# Patient Record
Sex: Male | Born: 1965 | Race: Black or African American | Hispanic: No | Marital: Single | Smoking: Current every day smoker
Health system: Southern US, Community
[De-identification: ages and names within clinical notes are randomized; demographics above are authoritative.]

---

## 2008-12-25 ENCOUNTER — Emergency Department: Payer: Self-pay | Admitting: Emergency Medicine

## 2009-06-01 ENCOUNTER — Emergency Department: Payer: Self-pay | Admitting: Emergency Medicine

## 2009-12-09 ENCOUNTER — Emergency Department: Payer: Self-pay

## 2011-01-19 ENCOUNTER — Emergency Department: Payer: Self-pay | Admitting: Emergency Medicine

## 2017-04-04 ENCOUNTER — Encounter: Payer: Self-pay | Admitting: Emergency Medicine

## 2017-04-04 ENCOUNTER — Emergency Department
Admission: EM | Admit: 2017-04-04 | Discharge: 2017-04-04 | Disposition: A | Discharge status: Home / Self Care | Payer: Managed Care, Other (non HMO) | Attending: Emergency Medicine | Admitting: Emergency Medicine

## 2017-04-04 ENCOUNTER — Emergency Department: Payer: Managed Care, Other (non HMO)

## 2017-04-04 DIAGNOSIS — R0789 Other chest pain: Secondary | ICD-10-CM | POA: Insufficient documentation

## 2017-04-04 DIAGNOSIS — F17210 Nicotine dependence, cigarettes, uncomplicated: Secondary | ICD-10-CM | POA: Insufficient documentation

## 2017-04-04 DIAGNOSIS — R079 Chest pain, unspecified: Secondary | ICD-10-CM | POA: Diagnosis present

## 2017-04-04 LAB — BASIC METABOLIC PANEL
ANION GAP: 8 (ref 5–15)
BUN: 16 mg/dL (ref 6–20)
CALCIUM: 9.8 mg/dL (ref 8.9–10.3)
CO2: 26 mmol/L (ref 22–32)
Chloride: 102 mmol/L (ref 101–111)
Creatinine, Ser: 0.86 mg/dL (ref 0.61–1.24)
GFR calc Af Amer: >60 mL/min (ref >60)
GLUCOSE: 102 mg/dL — AB (ref 65–99)
Potassium: 4 mmol/L (ref 3.5–5.1)
Sodium: 136 mmol/L (ref 135–145)

## 2017-04-04 LAB — CBC
HCT: 45 % (ref 40.0–52.0)
Hemoglobin: 15.4 g/dL (ref 13.0–18.0)
MCH: 31.8 pg (ref 26.0–34.0)
MCHC: 34.1 g/dL (ref 32.0–36.0)
MCV: 93.3 fL (ref 80.0–100.0)
PLATELETS: 219 10*3/uL (ref 150–440)
RBC: 4.82 MIL/uL (ref 4.40–5.90)
RDW: 13.9 % (ref 11.5–14.5)
WBC: 10.3 10*3/uL (ref 3.8–10.6)

## 2017-04-04 LAB — TROPONIN I: Troponin I: <0.03 ng/mL (ref <0.03)

## 2017-04-04 MED ORDER — IBUPROFEN 600 MG PO TABS: 600.0000 mg | ORAL_TABLET | Freq: Three times a day (TID) | ORAL | 0 refills | Status: AC | PRN

## 2017-04-04 MED ORDER — KETOROLAC TROMETHAMINE 30 MG/ML IJ SOLN
15.0000 mg | Freq: Once | INTRAMUSCULAR | Status: DC
  Filled 2017-04-04: qty 1

## 2017-04-04 NOTE — Discharge Instructions (Addendum)
Please make an appointment to establish care with a primary care physician this coming week. Return to the emergency department sooner for any concerns.  It was a pleasure to take care of you today, and thank you for coming to our emergency department.  If you have any questions or concerns before leaving please ask the nurse to grab me and I'm more than happy to go through your aftercare instructions again.  If you were prescribed any opioid pain medication today such as Norco, Vicodin, Percocet, morphine, hydrocodone, or oxycodone please make sure you do not drive when you are taking this medication as it can alter your ability to drive safely.  If you have any concerns once you are home that you are not improving or are in fact getting worse before you can make it to your follow-up appointment, please do not hesitate to call 911 and come back for further evaluation.  Merrily BrittleNeil Faige Seely MD  Results for orders placed or performed during the hospital encounter of 04/04/17  CBC  Result Value Ref Range   WBC 10.3 3.8 - 10.6 K/uL   RBC 4.82 4.40 - 5.90 MIL/uL   Hemoglobin 15.4 13.0 - 18.0 g/dL   HCT 16.145.0 09.640.0 - 04.552.0 %   MCV 93.3 80.0 - 100.0 fL   MCH 31.8 26.0 - 34.0 pg   MCHC 34.1 32.0 - 36.0 g/dL   RDW 40.913.9 81.111.5 - 91.414.5 %   Platelets 219 150 - 440 K/uL  Basic metabolic panel  Result Value Ref Range   Sodium 136 135 - 145 mmol/L   Potassium 4.0 3.5 - 5.1 mmol/L   Chloride 102 101 - 111 mmol/L   CO2 26 22 - 32 mmol/L   Glucose, Bld 102 (H) 65 - 99 mg/dL   BUN 16 6 - 20 mg/dL   Creatinine, Ser 7.820.86 0.61 - 1.24 mg/dL   Calcium 9.8 8.9 - 95.610.3 mg/dL   GFR calc non Af Amer >60 >60 mL/min   GFR calc Af Amer >60 >60 mL/min   Anion gap 8 5 - 15  Troponin I  Result Value Ref Range   Troponin I <0.03 <0.03 ng/mL   Dg Chest 2 View  Result Date: 04/04/2017 CLINICAL DATA:  Upper anterior chest pain. EXAM: CHEST  2 VIEW COMPARISON:  12/09/2009 FINDINGS: The lungs are clear without focal pneumonia,  edema, pneumothorax or pleural effusion. Nodular density at the right base is probably a nipple shadow but is asymmetric. The cardiopericardial silhouette is within normal limits for size. The visualized bony structures of the thorax are intact. IMPRESSION: 1. Nodular opacity over the right lung base, likely a nipple shadow, but repeat frontal radiograph with nipple markers recommended to confirm. 2. Otherwise no acute cardiopulmonary findings. Electronically Signed   By: Kennith CenterEric  Mansell M.D.   On: 04/04/2017 17:04

## 2017-04-04 NOTE — ED Notes (Signed)
Patient transported to X-ray 

## 2017-04-04 NOTE — ED Notes (Signed)
Pt resting in bed, denies any needs, watching TV

## 2017-04-04 NOTE — ED Notes (Signed)
Was unable to collect both tubes of blood. Patient asked to get blood drawn in the room after an IV was placed. So he wouldn't be stuck multiple times.

## 2017-04-04 NOTE — ED Provider Notes (Signed)
[facility] Emergency Department Provider Note  ____________________________________________   First MD Initiated Contact with Patient 04/04/17 1708     (approximate)  I have reviewed the triage vital signs and the nursing notes.   HISTORY  Chief Complaint Chest Pain   HPI Daniel Wilkins is a 51 y.o. male who self presents to the emergency department with atypical chest pain ever since he awoke this morning. Pain is sharp and aching constant in his left upper chest. Somewhat worse with twisting or taking a deep breath. Nonexertional. No shortness of breath. No fevers or chills. No leg swelling. No immobilization. He takes no medications and has no past medical history.    History reviewed. No pertinent past medical history.  There are no active problems to display for this patient.   History reviewed. No pertinent surgical history.  Prior to Admission medications   Medication Sig Start Date End Date Taking? Authorizing Provider  ibuprofen (ADVIL,MOTRIN) 600 MG tablet Take 1 tablet (600 mg total) by mouth every 8 (eight) hours as needed. 04/04/17   Merrily Brittle, MD    Allergies Patient has no known allergies.  No family history on file.  Social History Social History  Substance Use Topics  . Smoking status: Current Every Day Smoker    Packs/day: 0.20    Types: Cigarettes  . Smokeless tobacco: Never Used  . Alcohol use Yes     Comment: beer daily    Review of Systems Constitutional: No fever/chills Eyes: No visual changes. ENT: No sore throat. Cardiovascular: Positive chest pain. Respiratory: Denies shortness of breath. Gastrointestinal: No abdominal pain.  No nausea, no vomiting.  No diarrhea.  No constipation. Genitourinary: Negative for dysuria. Musculoskeletal: Negative for back pain. Skin: Negative for rash. Neurological: Negative for headaches, focal weakness or  numbness.   ____________________________________________   PHYSICAL EXAM:  VITAL SIGNS: ED Triage Vitals  Enc Vitals Group     BP 04/04/17 1540 (!) 181/88     Pulse Rate 04/04/17 1540 99     Resp 04/04/17 1540 16     Temp 04/04/17 1540 98 F (36.7 C)     Temp Source 04/04/17 1540 Oral     SpO2 04/04/17 1540 100 %     Weight 04/04/17 1541 190 lb (86.2 kg)     Height 04/04/17 1541 5\' 6"  (1.676 m)     Head Circumference --      Peak Flow --      Pain Score 04/04/17 1550 7     Pain Loc --      Pain Edu? --      Excl. in GC? --     Constitutional: Alert and oriented 4 very well-appearing nontoxic no diaphoresis speaks in full clear sentences Eyes: PERRL EOMI. Head: Atraumatic. Nose: No congestion/rhinnorhea. Mouth/Throat: No trismus Neck: No stridor.   Cardiovascular: Normal rate, regular rhythm. Grossly normal heart sounds.  Good peripheral circulation. Focally tender across left lateral Respiratory: Normal respiratory effort.  No retractions. Lungs CTAB and moving good air Gastrointestinal: Soft nondistended nontender no rebound or guarding no peritonitis Musculoskeletal: No lower extremity edema   Neurologic:  Normal speech and language. No gross focal neurologic deficits are appreciated. Skin:  Skin is warm, dry and intact. No rash noted. Psychiatric: Mood and affect are normal. Speech and behavior are normal.    ____________________________________________   DIFFERENTIAL includes but not limited to  Acute coronary syndrome, pulmonary embolism, Boerhaave syndrome, aortic dissection, pneumothorax, hemothorax ____________________________________________  LABS (all labs ordered are listed, but only abnormal results are displayed)  Labs Reviewed  BASIC METABOLIC PANEL - Abnormal; Notable for the following:       Result Value   Glucose, Bld 102 (*)    All other components within normal limits  CBC  TROPONIN I    No signs of acute  ischemia __________________________________________  EKG  ED ECG REPORT I, Merrily BrittleNeil Aeron Donaghey, the attending physician, personally viewed and interpreted this ECG.  Date: 04/04/2017 Rate: 90 Rhythm: normal sinus rhythm QRS Axis: normal Intervals: normal ST/T Wave abnormalities: normal Narrative Interpretation: unremarkable  ____________________________________________  RADIOLOGY  Chest x-ray with no acute disease ____________________________________________   PROCEDURES  Procedure(s) performed: no  Procedures  Critical Care performed: no  Observation: no ____________________________________________   INITIAL IMPRESSION / ASSESSMENT AND PLAN / ED COURSE  Pertinent labs & imaging results that were available during my care of the patient were reviewed by me and considered in my medical decision making (see chart for details).  The patient arrives very well-appearing with atypical chest pain and a normal EKG. His pain is nonexertional sharp and pleuritic. Doubt pulmonary embolism and he is PERC negative aside from age.  His pain is quite reproducible and is improved with Toradol. At this point I'm comfortable discharging him home with primary care follow-up. He is discharged home in improved condition.      ____________________________________________   FINAL CLINICAL IMPRESSION(S) / ED DIAGNOSES  Final diagnoses:  Atypical chest pain      NEW MEDICATIONS STARTED DURING THIS VISIT:  New Prescriptions   IBUPROFEN (ADVIL,MOTRIN) 600 MG TABLET    Take 1 tablet (600 mg total) by mouth every 8 (eight) hours as needed.     Note:  This document was prepared using Dragon voice recognition software and may include unintentional dictation errors.     Merrily Brittleifenbark, Mansel Strother, MD 04/04/17 Rickey [id number]

## 2017-04-04 NOTE — ED Triage Notes (Signed)
Patient presents to the ED with left upper chest pain x 2 days radiating into left arm.  Patient states pain in chest is worse when patient moves his arms in certain ways.  Patient reports pain in left arm feels like, "pins and needles".  Patient is in no obvious distress at this time, denies any dizziness or shortness of breath.

## 2017-04-04 NOTE — ED Notes (Signed)
Pt resting in bed, refused pain meds at this time, states he is not in pain, will continue to offer toradol to pt

## 2020-01-02 ENCOUNTER — Ambulatory Visit: Payer: 59 | Attending: Internal Medicine

## 2020-01-02 DIAGNOSIS — Z23 Encounter for immunization: Secondary | ICD-10-CM

## 2020-01-02 NOTE — Progress Notes (Signed)
   Covid-19 Vaccination Clinic  Name:  SHERWOOD CASTILLA    MRN: [id number] DOB: [birth year]  01/02/2020  Mr. Lieb was observed post Covid-19 immunization for 15 minutes without incident. He was provided with Vaccine Information Sheet and instruction to access the V-Safe system.   Mr. Jutras was instructed to call 911 with any severe reactions post vaccine: Marland Kitchen Difficulty breathing  . Swelling of face and throat  . A fast heartbeat  . A bad rash all over body  . Dizziness and weakness   Immunizations Administered    Name Date Dose VIS Date Route   Pfizer COVID-19 Vaccine 01/02/2020  2:59 PM 0.3 mL 09/09/2019 Intramuscular   Manufacturer: [employer], [employer]   Lot: [id number]   NDC: [address]-4

## 2020-01-25 ENCOUNTER — Other Ambulatory Visit: Payer: Self-pay

## 2020-01-25 ENCOUNTER — Ambulatory Visit: Payer: 59 | Attending: Internal Medicine

## 2020-01-25 DIAGNOSIS — Z23 Encounter for immunization: Secondary | ICD-10-CM

## 2020-01-25 NOTE — Progress Notes (Signed)
   Covid-19 Vaccination Clinic  Name:  Daniel Wilkins    MRN: [id number] DOB: 12-01-1965  01/25/2020  [name] was observed post Covid-19 immunization for 15 minutes without incident. He was provided with Vaccine Information Sheet and instruction to access the V-Safe system.   [name] was instructed to call 911 with any severe reactions post vaccine: Marland Kitchen Difficulty breathing  . Swelling of face and throat  . A fast heartbeat  . A bad rash all over body  . Dizziness and weakness   Immunizations Administered    Name Date Dose VIS Date Route   Pfizer COVID-19 Vaccine 01/25/2020  1:43 PM 0.3 mL 11/23/2018 Intramuscular   Manufacturer: [employer], [employer]   Lot: [id number]   NDC: [address]-1

## 2020-07-10 ENCOUNTER — Emergency Department
Admission: EM | Admit: 2020-07-10 | Discharge: 2020-07-10 | Disposition: A | Discharge status: Home / Self Care | Payer: No Typology Code available for payment source | Attending: Emergency Medicine | Admitting: Emergency Medicine

## 2020-07-10 ENCOUNTER — Other Ambulatory Visit: Payer: Self-pay

## 2020-07-10 ENCOUNTER — Emergency Department: Payer: No Typology Code available for payment source

## 2020-07-10 DIAGNOSIS — F17210 Nicotine dependence, cigarettes, uncomplicated: Secondary | ICD-10-CM | POA: Diagnosis not present

## 2020-07-10 DIAGNOSIS — M542 Cervicalgia: Secondary | ICD-10-CM | POA: Diagnosis present

## 2020-07-10 DIAGNOSIS — I10 Essential (primary) hypertension: Secondary | ICD-10-CM | POA: Diagnosis not present

## 2020-07-10 DIAGNOSIS — Z79899 Other long term (current) drug therapy: Secondary | ICD-10-CM | POA: Diagnosis not present

## 2020-07-10 DIAGNOSIS — Z720 Tobacco use: Secondary | ICD-10-CM

## 2020-07-10 DIAGNOSIS — I6523 Occlusion and stenosis of bilateral carotid arteries: Secondary | ICD-10-CM | POA: Diagnosis not present

## 2020-07-10 DIAGNOSIS — R202 Paresthesia of skin: Secondary | ICD-10-CM | POA: Diagnosis not present

## 2020-07-10 DIAGNOSIS — Z7141 Alcohol abuse counseling and surveillance of alcoholic: Secondary | ICD-10-CM | POA: Insufficient documentation

## 2020-07-10 LAB — PROTIME-INR
INR: 0.8 (ref 0.8–1.2)
Prothrombin Time: 11.2 seconds — ABNORMAL LOW (ref 11.4–15.2)

## 2020-07-10 LAB — COMPREHENSIVE METABOLIC PANEL
ALT: 18 U/L (ref 0–44)
AST: 23 U/L (ref 15–41)
Albumin: 4.5 g/dL (ref 3.5–5.0)
Alkaline Phosphatase: 50 U/L (ref 38–126)
Anion gap: 9 (ref 5–15)
BUN: 17 mg/dL (ref 6–20)
CO2: 25 mmol/L (ref 22–32)
Calcium: 9.8 mg/dL (ref 8.9–10.3)
Chloride: 104 mmol/L (ref 98–111)
Creatinine, Ser: 0.87 mg/dL (ref 0.61–1.24)
GFR, Estimated: >60 mL/min (ref >60)
Glucose, Bld: 88 mg/dL (ref 70–99)
Potassium: 3.5 mmol/L (ref 3.5–5.1)
Sodium: 138 mmol/L (ref 135–145)
Total Bilirubin: 0.7 mg/dL (ref 0.3–1.2)
Total Protein: 8.1 g/dL (ref 6.5–8.1)

## 2020-07-10 LAB — CBC
HCT: 42.2 % (ref 39.0–52.0)
Hemoglobin: 13.9 g/dL (ref 13.0–17.0)
MCH: 31 pg (ref 26.0–34.0)
MCHC: 32.9 g/dL (ref 30.0–36.0)
MCV: 94 fL (ref 80.0–100.0)
Platelets: 280 10*3/uL (ref 150–400)
RBC: 4.49 MIL/uL (ref 4.22–5.81)
RDW: 13.5 % (ref 11.5–15.5)
WBC: 7.9 10*3/uL (ref 4.0–10.5)
nRBC: 0 % (ref 0.0–0.2)

## 2020-07-10 LAB — DIFFERENTIAL
Abs Immature Granulocytes: 0.05 10*3/uL (ref 0.00–0.07)
Basophils Absolute: 0.1 10*3/uL (ref 0.0–0.1)
Basophils Relative: 1 %
Eosinophils Absolute: 0.2 10*3/uL (ref 0.0–0.5)
Eosinophils Relative: 3 %
Immature Granulocytes: 1 %
Lymphocytes Relative: 16 %
Lymphs Abs: 1.2 10*3/uL (ref 0.7–4.0)
Monocytes Absolute: 0.6 10*3/uL (ref 0.1–1.0)
Monocytes Relative: 7 %
Neutro Abs: 5.7 10*3/uL (ref 1.7–7.7)
Neutrophils Relative %: 72 %

## 2020-07-10 LAB — TROPONIN I (HIGH SENSITIVITY)
Troponin I (High Sensitivity): 5 ng/L (ref <18)
Troponin I (High Sensitivity): 5 ng/L (ref <18)

## 2020-07-10 LAB — APTT: aPTT: 34 seconds (ref 24–36)

## 2020-07-10 MED ORDER — LABETALOL HCL 5 MG/ML IV SOLN
10.0000 mg | Freq: Once | INTRAVENOUS | Status: AC
  Administered 2020-07-10: 10 mg via INTRAVENOUS
  Filled 2020-07-10: qty 4

## 2020-07-10 MED ORDER — THIAMINE HCL 100 MG/ML IJ SOLN: 100.0000 mg | Freq: Every day | INTRAMUSCULAR | Status: DC

## 2020-07-10 MED ORDER — LORAZEPAM 2 MG PO TABS: 0.0000 mg | ORAL_TABLET | Freq: Two times a day (BID) | ORAL | Status: DC

## 2020-07-10 MED ORDER — THIAMINE HCL 100 MG PO TABS: 100.0000 mg | ORAL_TABLET | Freq: Every day | ORAL | Status: DC

## 2020-07-10 MED ORDER — LORAZEPAM 2 MG/ML IJ SOLN: 0.0000 mg | Freq: Two times a day (BID) | INTRAMUSCULAR | Status: DC

## 2020-07-10 MED ORDER — LORAZEPAM 2 MG PO TABS: 0.0000 mg | ORAL_TABLET | Freq: Four times a day (QID) | ORAL | Status: DC

## 2020-07-10 MED ORDER — IOHEXOL 350 MG/ML SOLN
75.0000 mL | Freq: Once | INTRAVENOUS | Status: AC | PRN
  Administered 2020-07-10: 75 mL via INTRAVENOUS

## 2020-07-10 MED ORDER — LORAZEPAM 2 MG/ML IJ SOLN: 0.0000 mg | Freq: Four times a day (QID) | INTRAMUSCULAR | Status: DC

## 2020-07-10 MED ORDER — ACETAMINOPHEN 500 MG PO TABS
1000.0000 mg | ORAL_TABLET | Freq: Once | ORAL | Status: AC
  Administered 2020-07-10: 1000 mg via ORAL
  Filled 2020-07-10: qty 2

## 2020-07-10 MED ORDER — SODIUM CHLORIDE 0.9% FLUSH: 3.0000 mL | Freq: Once | INTRAVENOUS | Status: DC

## 2020-07-10 MED ORDER — AMLODIPINE BESYLATE 10 MG PO TABS: 10.0000 mg | ORAL_TABLET | Freq: Every day | ORAL | 0 refills | Status: AC

## 2020-07-10 MED ORDER — LIDOCAINE 5 % EX PTCH
1.0000 | MEDICATED_PATCH | CUTANEOUS | Status: DC
  Administered 2020-07-10: 1 via TRANSDERMAL
  Filled 2020-07-10: qty 1

## 2020-07-10 MED ORDER — KETOROLAC TROMETHAMINE 30 MG/ML IJ SOLN
30.0000 mg | Freq: Once | INTRAMUSCULAR | Status: AC
  Administered 2020-07-10: 30 mg via INTRAVENOUS
  Filled 2020-07-10: qty 1

## 2020-07-10 MED ORDER — LABETALOL HCL 5 MG/ML IV SOLN: 20.0000 mg | Freq: Once | INTRAVENOUS | Status: DC

## 2020-07-10 NOTE — Discharge Instructions (Signed)
Your CT Head and neck showed  IMPRESSION:  1. Negative for intracranial large vessel occlusion  2. 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 Right vertebral artery widely patent. Moderate stenosis proximal  left vertebral artery.

## 2020-07-10 NOTE — ED Triage Notes (Signed)
Pt comes via POV from home with c/o numbness, arm and neck pain. Pt states this started over the weekend and comes and goes. Pt states intense pain. Pt denies any recent injuries.  Pt denies any CP or SOB.  Pt states numbness in right arm and fingers. Pt denies any blurry vision, weakness or headache.   Current BP-202/111

## 2020-07-10 NOTE — ED Provider Notes (Signed)
[facility] Emergency Department Provider Note  ____________________________________________   First MD Initiated Contact with Patient 07/10/20 1215     (approximate)  I have reviewed the triage vital signs and the nursing notes.   HISTORY  Chief Complaint Neck Pain, Arm Pain, and Numbness   HPI Daniel Wilkins is a 54 y.o. male with no significant past medical history who presents for assessment of some numbness in his left arm associated some left posterior neck pain that began 3 to 4 days ago and has been intermittent but persistent and not alleviated by topical icy hot patch.  Patient denies any weakness, chest pain, cough, shortness of breath, right upper extremity pain weakness or numbness, headache, vision changes, vertigo, nausea, vomiting, diarrhea, dysuria, abdominal pain, other back pain, rash, lower extremity pain weakness or numbness or other acute complaints.  Denies illicit drug use but endorses drink approximately 3 beers per day and tobacco abuse.  No prior synopsis.  No clear alleviating aggravating factors.  Denies any history of hypertension.         History reviewed. No pertinent past medical history.  There are no problems to display for this patient.   History reviewed. No pertinent surgical history.  Prior to Admission medications   Medication Sig Start Date End Date Taking? Authorizing Provider  amLODipine (NORVASC) 10 MG tablet Take 1 tablet (10 mg total) by mouth daily. 07/10/20 08/09/20  Gilles Chiquito, MD  ibuprofen (ADVIL,MOTRIN) 600 MG tablet Take 1 tablet (600 mg total) by mouth every 8 (eight) hours as needed. Patient not taking: Reported on 07/10/2020 04/04/17   Merrily Brittle, MD    Allergies Patient has no known allergies.  No family history on file.  Social History Social History   Tobacco Use  . Smoking status: Current Every Day Smoker    Packs/day: 0.20    Types: Cigarettes  . Smokeless tobacco: Never Used   Substance Use Topics  . Alcohol use: Yes    Comment: beer daily  . Drug use: No    Review of Systems  Review of Systems  Constitutional: Negative for chills and fever.  HENT: Negative for sore throat.   Eyes: Negative for pain.  Respiratory: Negative for cough and stridor.   Cardiovascular: Negative for chest pain.  Gastrointestinal: Negative for vomiting.  Genitourinary: Negative for dysuria.  Musculoskeletal: Positive for myalgias ( L neck ).  Skin: Negative for rash.  Neurological: Negative for seizures, loss of consciousness and headaches.  Psychiatric/Behavioral: Negative for suicidal ideas.  All other systems reviewed and are negative.     ____________________________________________   PHYSICAL EXAM:  VITAL SIGNS: ED Triage Vitals [07/10/20 0839]  Enc Vitals Group     BP (!) 202/111     Pulse Rate 85     Resp 18     Temp 97.6 F (36.4 C)     Temp src      SpO2 100 %     Weight 200 lb (90.7 kg)     Height 5\' 7"  (1.702 m)     Head Circumference      Peak Flow      Pain Score 10     Pain Loc      Pain Edu?      Excl. in GC?    Vitals:   07/10/20 1340 07/10/20 1450  BP: (!) 153/90 (!) 159/75  Pulse: 79   Resp: 15 13  Temp:    SpO2: 98%  Physical Exam Vitals and nursing note reviewed.  Constitutional:      Appearance: He is well-developed.  HENT:     Head: Normocephalic and atraumatic.     Right Ear: External ear normal.     Left Ear: External ear normal.     Nose: Nose normal.     Mouth/Throat:     Mouth: Mucous membranes are moist.  Eyes:     Conjunctiva/sclera: Conjunctivae normal.  Cardiovascular:     Rate and Rhythm: Normal rate and regular rhythm.     Heart sounds: No murmur heard.   Pulmonary:     Effort: Pulmonary effort is normal. No respiratory distress.     Breath sounds: Normal breath sounds.  Abdominal:     Palpations: Abdomen is soft.     Tenderness: There is no abdominal tenderness.  Musculoskeletal:     Cervical  back: Neck supple.  Skin:    General: Skin is warm and dry.     Capillary Refill: Capillary refill takes less than 2 seconds.  Neurological:     General: No focal deficit present.     Mental Status: He is alert and oriented to person, place, and time.  Psychiatric:        Mood and Affect: Mood normal.     No pronator drift.  No finger dysmetria.  Cranial nerves II through XII grossly intact.  Patient has full and symmetric strength on his bilateral upper and lower extremities.  Sensation intact light touch throughout all extremities.  No tenderness or deformities over the C/T/L-spine.  Patient is tender over his left trapezius muscles.  Sensation is intact in the distribution of the radial ulnar and median nerves in the bilateral upper extremities.  2+ bilateral radial pulses. ____________________________________________   LABS (all labs ordered are listed, but only abnormal results are displayed)  Labs Reviewed  PROTIME-INR - Abnormal; Notable for the following components:      Result Value   Prothrombin Time 11.2 (*)    All other components within normal limits  APTT  CBC  DIFFERENTIAL  COMPREHENSIVE METABOLIC PANEL  I-STAT CREATININE, ED  CBG MONITORING, ED  TROPONIN I (HIGH SENSITIVITY)  TROPONIN I (HIGH SENSITIVITY)   ____________________________________________  EKG  Sinus rhythm with a ventricular rate of 81, normal axis, unremarkable intervals, and no clear evidence of acute ischemia aside from a T wave inversion in lead III. ____________________________________________  RADIOLOGY  ED MD interpretation: No evidence of intracranial bleeding or carotid dissection.  Official radiology report(s): CT Angio Head W or Wo Contrast  Result Date: 07/10/2020 CLINICAL DATA:  Stroke/TIA.  Numbness right arm 1 week. EXAM: CT ANGIOGRAPHY HEAD AND NECK TECHNIQUE: Multidetector CT imaging of the head and neck was performed using the standard protocol during bolus administration  of intravenous contrast. Multiplanar CT image reconstructions and MIPs were obtained to evaluate the vascular anatomy. Carotid stenosis measurements (when applicable) are obtained utilizing NASCET criteria, using the distal internal carotid diameter as the denominator. CONTRAST:  90mL OMNIPAQUE IOHEXOL 350 MG/ML SOLN COMPARISON:  CT head 07/10/2020 FINDINGS: CTA NECK FINDINGS Aortic arch: Atherosclerotic aortic arch without aneurysm. Bovine branching arch. Proximal great vessels patent. Right carotid system: Atherosclerotic disease right carotid bifurcation. 40% diameter stenosis proximal right internal carotid artery. Left carotid system: Atherosclerotic disease left carotid bifurcation with less than 25% diameter stenosis of the left internal carotid artery. Vertebral arteries: Right vertebral artery patent to the basilar without stenosis. Moderately severe calcific stenosis at the origin of the left  vertebral artery which is then patent to the basilar without additional stenosis. Skeleton: No acute skeletal abnormality. Cervical spondylosis. Moderate to severe spinal stenosis at C4-5 due to central disc protrusion which is partially calcified. Multilevel foraminal encroachment due to spurring. Poor dentition with multiple caries and periapical lucencies. Other neck: Negative for soft tissue mass or adenopathy. Upper chest: Lung apices clear bilaterally. Review of the MIP images confirms the above findings CTA HEAD FINDINGS Anterior circulation: Atherosclerotic calcification in the cavernous carotid bilaterally with mild stenosis. Anterior and middle cerebral arteries patent bilaterally without significant stenosis or large vessel occlusion. Tortuous left A1 segment supplies the remainder of the left anterior cerebral artery. Posterior circulation: Both vertebral arteries patent to the basilar. PICA patent bilaterally. Basilar patent. Superior cerebellar and posterior cerebral arteries patent bilaterally. Venous  sinuses: Normal venous enhancement Anatomic variants: None Review of the MIP images confirms the above findings IMPRESSION: 1. Negative for intracranial large vessel occlusion 2. Atherosclerotic disease of the carotid bifurcation with 40% diameter stenosis of the right internal carotid artery and less than 25% diameter proximal left internal carotid artery 3. Right vertebral artery widely patent. Moderate stenosis proximal left vertebral artery. Electronically Signed   By: Marlan Palauharles  Clark M.D.   On: 07/10/2020 14:56   CT HEAD WO CONTRAST  Result Date: 07/10/2020 CLINICAL DATA:  Numbness EXAM: CT HEAD WITHOUT CONTRAST TECHNIQUE: Contiguous axial images were obtained from the base of the skull through the vertex without intravenous contrast. COMPARISON:  None. FINDINGS: Brain: No evidence of acute infarction, hemorrhage, hydrocephalus, extra-axial collection or mass lesion/mass effect. Probable prominent perivascular space in the region of the right basal ganglia versus a small remote lacunar infarct. Vascular: Atherosclerotic calcifications involving the large vessels of the skull base. No unexpected hyperdense vessel. Skull: None. Sinuses/Orbits: Mucosal thickening with near complete opacification of the left worse than right ethmoid air cells. There is also near complete opacification of the left frontal sinus. Mastoid air cells are clear. Orbital structures intact. Other: None. IMPRESSION: 1. No acute intracranial findings. 2. Paranasal sinus disease as above. Electronically Signed   By: Duanne GuessNicholas  Plundo D.O.   On: 07/10/2020 09:23   CT Angio Neck W and/or Wo Contrast  Result Date: 07/10/2020 CLINICAL DATA:  Stroke/TIA.  Numbness right arm 1 week. EXAM: CT ANGIOGRAPHY HEAD AND NECK TECHNIQUE: Multidetector CT imaging of the head and neck was performed using the standard protocol during bolus administration of intravenous contrast. Multiplanar CT image reconstructions and MIPs were obtained to evaluate the  vascular anatomy. Carotid stenosis measurements (when applicable) are obtained utilizing NASCET criteria, using the distal internal carotid diameter as the denominator. CONTRAST:  75mL OMNIPAQUE IOHEXOL 350 MG/ML SOLN COMPARISON:  CT head 07/10/2020 FINDINGS: CTA NECK FINDINGS Aortic arch: Atherosclerotic aortic arch without aneurysm. Bovine branching arch. Proximal great vessels patent. Right carotid system: Atherosclerotic disease right carotid bifurcation. 40% diameter stenosis proximal right internal carotid artery. Left carotid system: Atherosclerotic disease left carotid bifurcation with less than 25% diameter stenosis of the left internal carotid artery. Vertebral arteries: Right vertebral artery patent to the basilar without stenosis. Moderately severe calcific stenosis at the origin of the left vertebral artery which is then patent to the basilar without additional stenosis. Skeleton: No acute skeletal abnormality. Cervical spondylosis. Moderate to severe spinal stenosis at C4-5 due to central disc protrusion which is partially calcified. Multilevel foraminal encroachment due to spurring. Poor dentition with multiple caries and periapical lucencies. Other neck: Negative for soft tissue mass or adenopathy. Upper chest:  Lung apices clear bilaterally. Review of the MIP images confirms the above findings CTA HEAD FINDINGS Anterior circulation: Atherosclerotic calcification in the cavernous carotid bilaterally with mild stenosis. Anterior and middle cerebral arteries patent bilaterally without significant stenosis or large vessel occlusion. Tortuous left A1 segment supplies the remainder of the left anterior cerebral artery. Posterior circulation: Both vertebral arteries patent to the basilar. PICA patent bilaterally. Basilar patent. Superior cerebellar and posterior cerebral arteries patent bilaterally. Venous sinuses: Normal venous enhancement Anatomic variants: None Review of the MIP images confirms the above  findings IMPRESSION: 1. Negative for intracranial large vessel occlusion 2. Atherosclerotic disease of the carotid bifurcation with 40% diameter stenosis of the right internal carotid artery and less than 25% diameter proximal left internal carotid artery 3. Right vertebral artery widely patent. Moderate stenosis proximal left vertebral artery. Electronically Signed   By: Marlan Palau M.D.   On: 07/10/2020 14:56    ____________________________________________   PROCEDURES  Procedure(s) performed (including Critical Care):  .1-3 Lead EKG Interpretation Performed by: Gilles Chiquito, MD Authorized by: Gilles Chiquito, MD     Interpretation: normal     ECG rate assessment: normal     Rhythm: sinus rhythm     Ectopy: none     Conduction: normal       ____________________________________________   INITIAL IMPRESSION / ASSESSMENT AND PLAN / ED COURSE        Patient presents with [place] to history exam for assessment of severe left posterior neck pain.  Patient is very hypertensive with a BP of 202/111 on arrival with otherwise stable vital signs on recheck his BP was 223/109.  Patient has nonfocal neuro exam as above.  Patient is also neurovascular intact in his bilateral upper extremities.  Differential includes but is not limited to carotid dissection, cervical radiculopathy, trapezius musculoskeletal pain, meningitis, carotid dissection, and C-spine injury.  No history or exam findings to suggest an acute traumatic injury.  Patient has no fever or elevated white blood cell count or other findings on exam or history to suggest an acute infectious process.  CT obtained shows no evidence of C-spine injury or carotid dissection or other acute cervical process although does show evidence of some stenosis.  Low suspicion for ACS given patient denies any chest pain and has 2 nonelevated troponins obtained over 2 hours.  Patient is very tender over his left trapezius and after being treated  with below noted analgesia and hypertensive medication stated he felt much improved.  CBC and CMP obtained in triage showed no evidence of abnormality or significant electrolyte or metabolic derangement.  Given improving symptoms and blood pressure with otherwise stable vital signs and reassuring work-up I believe patient safe for discharge with close outpatient follow-up.  Counseled patient on his elevated blood pressure and carotid stenosis found today.  Counseled him on tobacco alcohol cessation.  Rx written for amlodipine.  Patient discharged stable condition.  Strict return precautions advised discussed.   ____________________________________________   FINAL CLINICAL IMPRESSION(S) / ED DIAGNOSES  Final diagnoses:  Neck pain  Bilateral carotid artery stenosis  Hypertension, unspecified type  Tobacco abuse  Alcohol cessation counseling    Medications  sodium chloride flush (NS) 0.9 % injection 3 mL (has no administration in time range)  lidocaine (LIDODERM) 5 % 1 patch (1 patch Transdermal Patch Applied 07/10/20 1331)  LORazepam (ATIVAN) injection 0-4 mg (0 mg Intravenous Not Given 07/10/20 1412)    Or  LORazepam (ATIVAN) tablet 0-4 mg ( Oral See  Alternative 07/10/20 1412)  LORazepam (ATIVAN) injection 0-4 mg (has no administration in time range)    Or  LORazepam (ATIVAN) tablet 0-4 mg (has no administration in time range)  thiamine tablet 100 mg (has no administration in time range)    Or  thiamine (B-1) injection 100 mg (has no administration in time range)  labetalol (NORMODYNE) injection 20 mg (0 mg Intravenous Hold 07/10/20 1453)  labetalol (NORMODYNE) injection 10 mg (10 mg Intravenous Given 07/10/20 1332)  acetaminophen (TYLENOL) tablet 1,000 mg (1,000 mg Oral Given 07/10/20 1332)  ketorolac (TORADOL) 30 MG/ML injection 30 mg (30 mg Intravenous Given 07/10/20 1331)  iohexol (OMNIPAQUE) 350 MG/ML injection 75 mL (75 mLs Intravenous Contrast Given 07/10/20 1430)     ED  Discharge Orders         Ordered    amLODipine (NORVASC) 10 MG tablet  Daily        07/10/20 1414           Note:  This document was prepared using Dragon voice recognition software and may include unintentional dictation errors.   Gilles Chiquito, MD 07/10/20 1515

## 2020-08-06 ENCOUNTER — Other Ambulatory Visit (HOSPITAL_COMMUNITY): Payer: Self-pay | Admitting: Nurse Practitioner

## 2020-08-06 ENCOUNTER — Other Ambulatory Visit: Payer: Self-pay | Admitting: Nurse Practitioner

## 2020-08-06 DIAGNOSIS — M542 Cervicalgia: Secondary | ICD-10-CM

## 2022-05-26 IMAGING — CT CT ANGIO NECK
2 of 7 series · 8 of 33 positions shown · IV contrast (APPLIED)
Comparison: CT head 07/10/2020

CLINICAL DATA: Stroke/TIA.  Numbness right arm 1 week.

EXAM:
CT ANGIOGRAPHY HEAD AND NECK
TECHNIQUE: Multidetector CT imaging of the head and neck was performed using
the standard protocol during bolus administration of intravenous
contrast. Multiplanar CT image reconstructions and MIPs were
obtained to evaluate the vascular anatomy. Carotid stenosis
measurements (when applicable) are obtained utilizing NASCET
criteria, using the distal internal carotid diameter as the
denominator.
CONTRAST:  75mL OMNIPAQUE IOHEXOL 350 MG/ML SOLN

[Series 4: cta head neck · axial · 0.58mm/px · z∈[-253,-137]mm · 2 of 175 slices shown]
[im 59/175  soft-tissue]
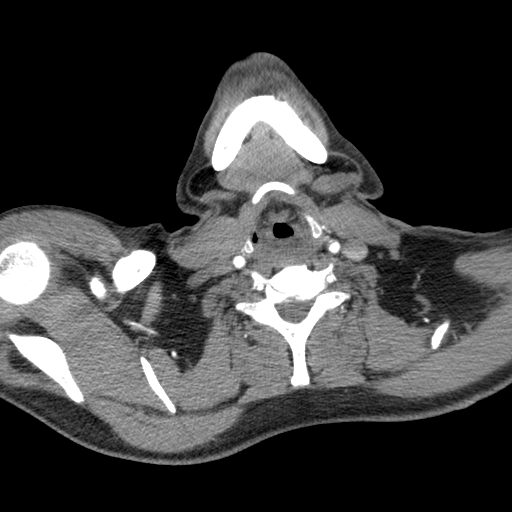
[im 117/175  soft-tissue]
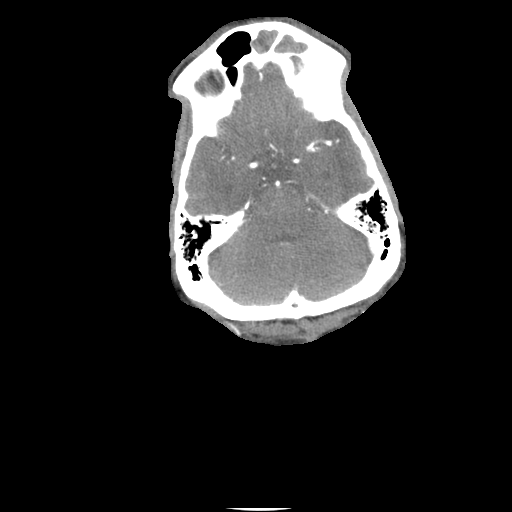

[Series 6: ax thin · axial · 0.49mm/px · z∈[-363,-113]mm · 6 of 362 slices shown]
[im 52/362  soft-tissue]
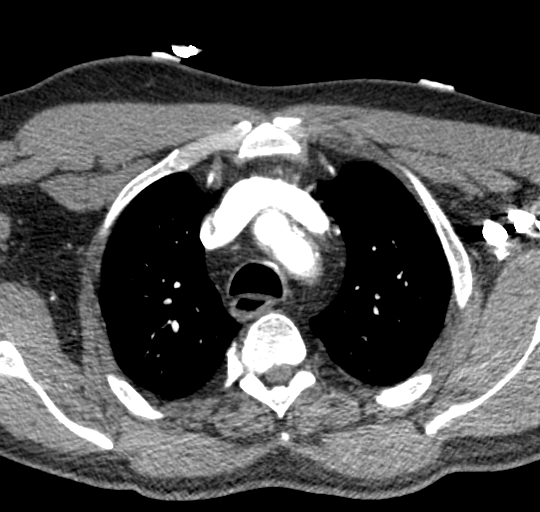
[im 104/362  bone]
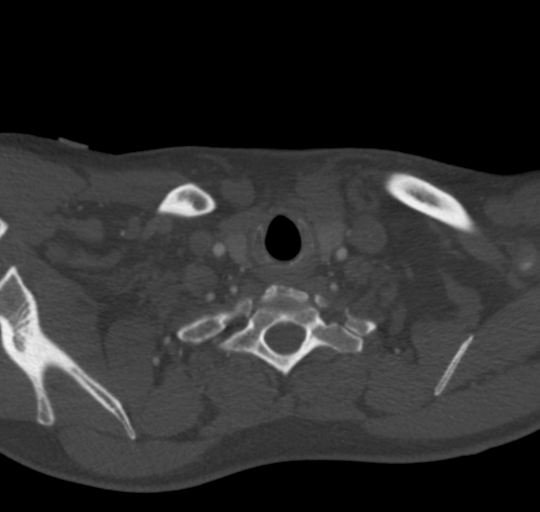
[im 155/362  soft-tissue]
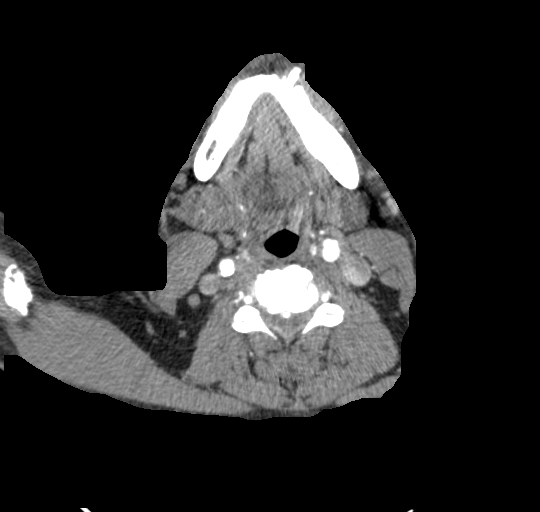
[im 207/362  bone]
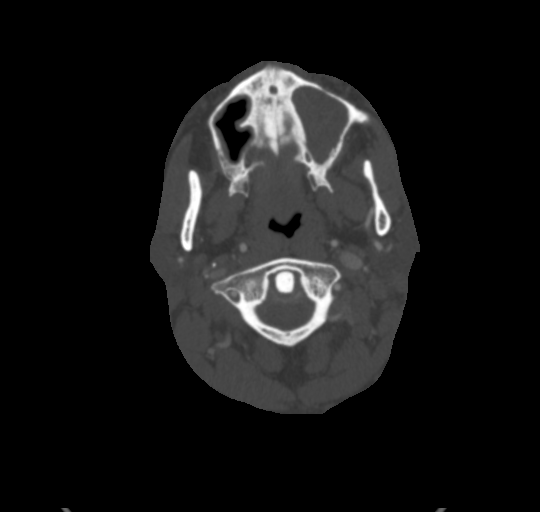
[im 258/362  soft-tissue]
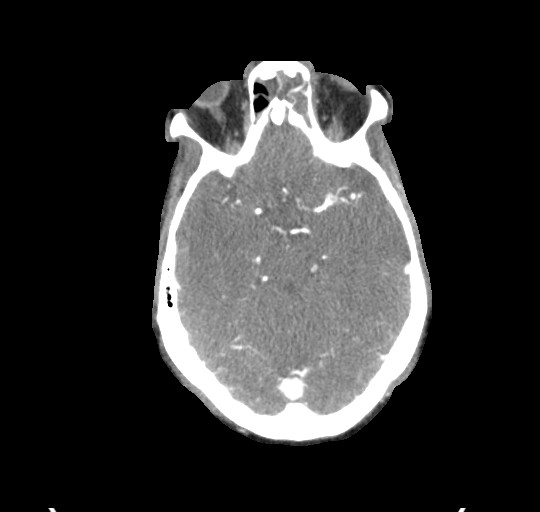
[im 310/362  bone]
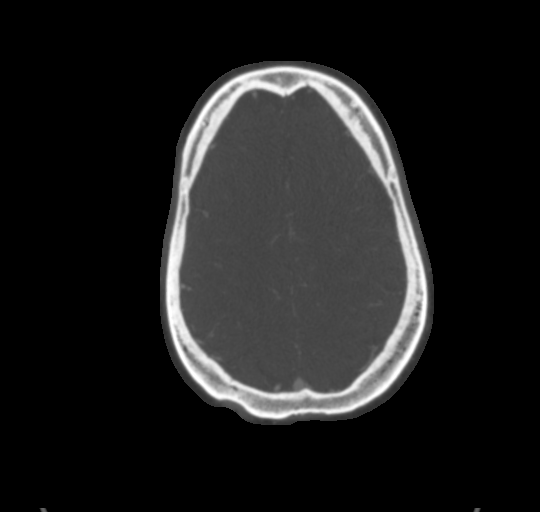

[8 of 33 positions shown; findings below may reference images not displayed]

FINDINGS: CTA NECK FINDINGS

Aortic arch: Atherosclerotic aortic arch without aneurysm. Bovine
branching arch. Proximal great vessels patent.

Right carotid system: Atherosclerotic disease right carotid
bifurcation. 40% diameter stenosis proximal right internal carotid
artery.

Left carotid system: Atherosclerotic disease left carotid
bifurcation with less than 25% diameter stenosis of the left
internal carotid artery.

Vertebral arteries: Right vertebral artery patent to the basilar
without stenosis. Moderately severe calcific stenosis at the origin
of the left vertebral artery which is then patent to the basilar
without additional stenosis.

Skeleton: No acute skeletal abnormality. Cervical spondylosis.
Moderate to severe spinal stenosis at C4-5 due to central disc
protrusion which is partially calcified. Multilevel foraminal
encroachment due to spurring.

Poor dentition with multiple caries and periapical lucencies.

Other neck: Negative for soft tissue mass or adenopathy.

Upper chest: Lung apices clear bilaterally.

Review of the MIP images confirms the above findings

CTA HEAD FINDINGS

Anterior circulation: Atherosclerotic calcification in the cavernous
carotid bilaterally with mild stenosis. Anterior and middle cerebral
arteries patent bilaterally without significant stenosis or large
vessel occlusion. Tortuous left A1 segment supplies the remainder of
the left anterior cerebral artery.

Posterior circulation: Both vertebral arteries patent to the
basilar. PICA patent bilaterally. Basilar patent. Superior
cerebellar and posterior cerebral arteries patent bilaterally.

Venous sinuses: Normal venous enhancement

Anatomic variants: None

Review of the MIP images confirms the above findings
IMPRESSION: 1. Negative for intracranial large vessel occlusion
2. Atherosclerotic disease of the carotid bifurcation with 40%
diameter stenosis of the right internal carotid artery and less than
25% diameter proximal left internal carotid artery
3. Right vertebral artery widely patent. Moderate stenosis proximal
left vertebral artery.

## 2024-09-12 ENCOUNTER — Emergency Department
Admission: EM | Admit: 2024-09-12 | Discharge: 2024-09-12 | Disposition: A | Discharge status: Home / Self Care | Attending: Emergency Medicine | Admitting: Emergency Medicine

## 2024-09-12 ENCOUNTER — Other Ambulatory Visit: Payer: Self-pay

## 2024-09-12 DIAGNOSIS — M545 Low back pain, unspecified: Secondary | ICD-10-CM

## 2024-09-12 MED ORDER — CYCLOBENZAPRINE HCL 5 MG PO TABS: 5.0000 mg | ORAL_TABLET | Freq: Three times a day (TID) | ORAL | 0 refills | Status: DC | PRN

## 2024-09-12 MED ORDER — KETOROLAC TROMETHAMINE 30 MG/ML IJ SOLN
30.0000 mg | Freq: Once | INTRAMUSCULAR | Status: AC
  Administered 2024-09-12: 12:00:00 30 mg via INTRAMUSCULAR
  Filled 2024-09-12: qty 1

## 2024-09-12 MED ORDER — DEXAMETHASONE SOD PHOSPHATE PF 10 MG/ML IJ SOLN
10.0000 mg | Freq: Once | INTRAMUSCULAR | Status: AC
  Administered 2024-09-12: 12:00:00 10 mg via INTRAMUSCULAR

## 2024-09-12 MED ORDER — LIDOCAINE 5 % EX PTCH
2.0000 | MEDICATED_PATCH | CUTANEOUS | Status: DC
  Administered 2024-09-12: 12:00:00 2 via TRANSDERMAL
  Filled 2024-09-12: qty 2

## 2024-09-12 MED ORDER — CYCLOBENZAPRINE HCL 5 MG PO TABS: 5.0000 mg | ORAL_TABLET | Freq: Three times a day (TID) | ORAL | 0 refills | Status: AC | PRN

## 2024-09-12 NOTE — ED Triage Notes (Signed)
 Pt comes with lower back pain for week. Pt denies any injuries.

## 2024-09-12 NOTE — Discharge Instructions (Signed)
 You were evaluated in the ED for low back pain.  Your physical exam findings are reassuring.  Please pick up prescribed medication from your pharmacy to help with pain.  Follow-up with your primary care provider.  If any new or worsening symptoms occur return to ED for further evaluation.  Pain control:  Ibuprofen  (motrin /aleve/advil ) - You can take 3 tablets (600 mg) every 6 hours as needed for pain.  Acetaminophen  (tylenol ) - You can take 2 extra strength tablets (1000 mg) every 6 hours as needed for pain.  You can alternate these medications or take them together.  Make sure you eat food/drink water when taking these medications.

## 2024-09-12 NOTE — ED Provider Notes (Signed)
 [facility] Emergency Department Provider Note     Event Date/Time   First MD Initiated Contact with Patient 09/12/24 1006     (approximate)   History   Back Pain   HPI  CLEMENT DENEAULT is a 58 y.o. male presents to the ED with complaint of lower back pain x 1 week. No falls or known injuries. No loss of bowel or bladder control. Has tried excedrin and vick's vapor rub that has provided some relief temporaily, but pain has returned.  Denies urinary symptoms.    Physical Exam   Triage Vital Signs: ED Triage Vitals  Encounter Vitals Group     BP 09/12/24 0928 (!) 179/98     Girls Systolic BP Percentile --      Girls Diastolic BP Percentile --      Boys Systolic BP Percentile --      Boys Diastolic BP Percentile --      Pulse Rate 09/12/24 0928 (!) 105     Resp 09/12/24 0928 18     Temp 09/12/24 0928 97.8 F (36.6 C)     Temp src --      SpO2 09/12/24 0928 90 %     Weight 09/12/24 0927 200 lb (90.7 kg)     Height 09/12/24 0927 5' 6 (1.676 m)     Head Circumference --      Peak Flow --      Pain Score 09/12/24 0927 10     Pain Loc --      Pain Education --      Exclude from Growth Chart --     Most recent vital signs: Vitals:   09/12/24 0928 09/12/24 1115  BP: (!) 179/98   Pulse: (!) 105   Resp: 18   Temp: 97.8 F (36.6 C)   SpO2: 90% 100%    General Awake, no distress.  HEENT NCAT.  CV:  Good peripheral perfusion.  RESP:  Normal effort.  ABD:  No distention.  Other:  Spinous process midline without deformity or tenderness along the entirety of spine.  There is paraspinal muscle tenderness to lumbar region bilaterally.  Pain with ROM specifically back extension.  No pain with flexion. (-) STLR test. Gait is steady.  ED Results / Procedures / Treatments   Labs (all labs ordered are listed, but only abnormal results are displayed) Labs Reviewed - No data to display  No results found.  PROCEDURES:  Critical Care performed:  No  Procedures   MEDICATIONS ORDERED IN ED: Medications  lidocaine  (LIDODERM ) 5 % 2 patch (2 patches Transdermal Patch Applied 09/12/24 1133)  ketorolac  (TORADOL ) 30 MG/ML injection 30 mg (30 mg Intramuscular Given 09/12/24 1133)  dexamethasone  (DECADRON ) injection 10 mg (10 mg Intramuscular Given 09/12/24 1133)     IMPRESSION / MDM / ASSESSMENT AND PLAN / ED COURSE  I reviewed the triage vital signs and the nursing notes.                               58 y.o. male presents to the emergency department for evaluation and treatment of acute atraumatic low back pain. See HPI for further details.   Differential diagnosis includes, but is not limited to strain, spasm, sciatica, cauda equina considered but less likely  Patient's presentation is most consistent with acute complicated illness / injury requiring diagnostic workup.  Patient is alert and oriented.  He is hemodynamic  stable.  Initial assessment he is well-appearing and comfortable lying on evaluation bed in no acute distress.  Physical exam findings are stated above.  No red flag signs to further workup with imaging.  Will treat with IM Toradol , Decadron .  Patient is here by himself.  Will send Flexeril  to pharmacy for outpatient management.  Advised to follow-up with primary care provider.  A referral has been placed.  He is in stable condition for discharge home.  ED return precaution discussed.  FINAL CLINICAL IMPRESSION(S) / ED DIAGNOSES   Final diagnoses:  Acute bilateral low back pain without sciatica   Rx / DC Orders   ED Discharge Orders          Ordered    cyclobenzaprine  (FLEXERIL ) 5 MG tablet  3 times daily PRN,   Status:  Discontinued        09/12/24 1057    cyclobenzaprine  (FLEXERIL ) 5 MG tablet  3 times daily PRN        09/12/24 1101    Ambulatory Referral to Primary Care (Establish Care)        09/12/24 1200           Note:  This document was prepared using Dragon voice recognition software and may  include unintentional dictation errors.    Margrette, Ruben Mahler A, PA-C 09/12/24 1205    Floy Roberts, MD 09/12/24 [phone]
